# Patient Record
Sex: Female | Born: 1960 | Race: White | Hispanic: No | Marital: Single | Smoking: Never smoker
Health system: Southern US, Community
[De-identification: ages and names within clinical notes are randomized; demographics above are authoritative.]

## PROBLEM LIST (undated history)

## (undated) DIAGNOSIS — C73 Malignant neoplasm of thyroid gland: Secondary | ICD-10-CM

---

## 2015-07-10 ENCOUNTER — Emergency Department (HOSPITAL_COMMUNITY)
Admission: EM | Admit: 2015-07-10 | Discharge: 2015-07-10 | Disposition: A | Discharge status: Home / Self Care | Payer: PRIVATE HEALTH INSURANCE | Attending: Emergency Medicine | Admitting: Emergency Medicine

## 2015-07-10 ENCOUNTER — Emergency Department (HOSPITAL_COMMUNITY): Payer: PRIVATE HEALTH INSURANCE

## 2015-07-10 ENCOUNTER — Encounter (HOSPITAL_COMMUNITY): Payer: Self-pay

## 2015-07-10 DIAGNOSIS — R55 Syncope and collapse: Secondary | ICD-10-CM | POA: Diagnosis not present

## 2015-07-10 DIAGNOSIS — Z880 Allergy status to penicillin: Secondary | ICD-10-CM | POA: Diagnosis not present

## 2015-07-10 DIAGNOSIS — Z85850 Personal history of malignant neoplasm of thyroid: Secondary | ICD-10-CM | POA: Insufficient documentation

## 2015-07-10 DIAGNOSIS — R0602 Shortness of breath: Secondary | ICD-10-CM | POA: Diagnosis not present

## 2015-07-10 DIAGNOSIS — R079 Chest pain, unspecified: Secondary | ICD-10-CM | POA: Insufficient documentation

## 2015-07-10 DIAGNOSIS — E860 Dehydration: Secondary | ICD-10-CM | POA: Insufficient documentation

## 2015-07-10 HISTORY — DX: Malignant neoplasm of thyroid gland: C73

## 2015-07-10 LAB — CBC
HEMATOCRIT: 46.9 % — AB (ref 36.0–46.0)
Hemoglobin: 16.1 g/dL — ABNORMAL HIGH (ref 12.0–15.0)
MCH: 31.7 pg (ref 26.0–34.0)
MCHC: 34.3 g/dL (ref 30.0–36.0)
MCV: 92.3 fL (ref 78.0–100.0)
PLATELETS: 247 10*3/uL (ref 150–400)
RBC: 5.08 MIL/uL (ref 3.87–5.11)
RDW: 12.9 % (ref 11.5–15.5)
WBC: 5.7 10*3/uL (ref 4.0–10.5)

## 2015-07-10 LAB — I-STAT TROPONIN, ED
TROPONIN I, POC: 0.02 ng/mL (ref 0.00–0.08)
Troponin i, poc: 0 ng/mL (ref 0.00–0.08)

## 2015-07-10 LAB — CK: Total CK: 138 U/L (ref 38–234)

## 2015-07-10 LAB — BASIC METABOLIC PANEL
ANION GAP: 14 (ref 5–15)
BUN: 18 mg/dL (ref 6–20)
CALCIUM: 10.8 mg/dL — AB (ref 8.9–10.3)
CHLORIDE: 105 mmol/L (ref 101–111)
CO2: 23 mmol/L (ref 22–32)
Creatinine, Ser: 1.32 mg/dL — ABNORMAL HIGH (ref 0.44–1.00)
GFR calc Af Amer: 53 mL/min — ABNORMAL LOW (ref >60)
GFR calc non Af Amer: 46 mL/min — ABNORMAL LOW (ref >60)
GLUCOSE: 98 mg/dL (ref 65–99)
POTASSIUM: 4.3 mmol/L (ref 3.5–5.1)
Sodium: 142 mmol/L (ref 135–145)

## 2015-07-10 MED ORDER — SODIUM CHLORIDE 0.9 % IV BOLUS (SEPSIS)
1000.0000 mL | Freq: Once | INTRAVENOUS | Status: AC
  Administered 2015-07-10: 1000 mL via INTRAVENOUS

## 2015-07-10 NOTE — Discharge Instructions (Signed)
Syncope °Syncope is a medical term for fainting or passing out. This means you lose consciousness and drop to the ground. People are generally unconscious for less than 5 minutes. You may have some muscle twitches for up to 15 seconds before waking up and returning to normal. Syncope occurs more often in older adults, but it can happen to anyone. While most causes of syncope are not dangerous, syncope can be a sign of a serious medical problem. It is important to seek medical care.  °CAUSES  °Syncope is caused by a sudden drop in blood flow to the brain. The specific cause is often not determined. Factors that can bring on syncope include: °· Taking medicines that lower blood pressure. °· Sudden changes in posture, such as standing up quickly. °· Taking more medicine than prescribed. °· Standing in one place for too long. °· Seizure disorders. °· Dehydration and excessive exposure to heat. °· Low blood sugar (hypoglycemia). °· Straining to have a bowel movement. °· Heart disease, irregular heartbeat, or other circulatory problems. °· Fear, emotional distress, seeing blood, or severe pain. °SYMPTOMS  °Right before fainting, you may: °· Feel dizzy or light-headed. °· Feel nauseous. °· See all white or all black in your field of vision. °· Have cold, clammy skin. °DIAGNOSIS  °Your health care provider will ask about your symptoms, perform a physical exam, and perform an electrocardiogram (ECG) to record the electrical activity of your heart. Your health care provider may also perform other heart or blood tests to determine the cause of your syncope which may include: °· Transthoracic echocardiogram (TTE). During echocardiography, sound waves are used to evaluate how blood flows through your heart. °· Transesophageal echocardiogram (TEE). °· Cardiac monitoring. This allows your health care provider to monitor your heart rate and rhythm in real time. °· Holter monitor. This is a portable device that records your  heartbeat and can help diagnose heart arrhythmias. It allows your health care provider to track your heart activity for several days, if needed. °· Stress tests by exercise or by giving medicine that makes the heart beat faster. °TREATMENT  °In most cases, no treatment is needed. Depending on the cause of your syncope, your health care provider may recommend changing or stopping some of your medicines. °HOME CARE INSTRUCTIONS °· Have someone stay with you until you feel stable. °· Do not drive, use machinery, or play sports until your health care provider says it is okay. °· Keep all follow-up appointments as directed by your health care provider. °· Lie down right away if you start feeling like you might faint. Breathe deeply and steadily. Wait until all the symptoms have passed. °· Drink enough fluids to keep your urine clear or pale yellow. °· If you are taking blood pressure or heart medicine, get up slowly and take several minutes to sit and then stand. This can reduce dizziness. °SEEK IMMEDIATE MEDICAL CARE IF:  °· You have a severe headache. °· You have unusual pain in the chest, abdomen, or back. °· You are bleeding from your mouth or rectum, or you have black or tarry stool. °· You have an irregular or very fast heartbeat. °· You have pain with breathing. °· You have repeated fainting or seizure-like jerking during an episode. °· You faint when sitting or lying down. °· You have confusion. °· You have trouble walking. °· You have severe weakness. °· You have vision problems. °If you fainted, call your local emergency services (911 in [place] Do not drive   yourself to the hospital.  °MAKE SURE YOU: °· Understand these instructions. °· Will watch your condition. °· Will get help right away if you are not doing well or get worse. °Document Released: 11/26/2005 Document Revised: 12/01/2013 Document Reviewed: 01/25/2012 °[employer]® Patient Information ©2015 [employer]. This information is not intended to replace  advice given to you by your health care provider. Make sure you discuss any questions you have with your health care provider. ° °

## 2015-07-10 NOTE — ED Notes (Signed)
Dr. Walden at bedside 

## 2015-07-10 NOTE — ED Notes (Signed)
Pt returned to room from x-ray. Monitored by pulse ox, bp cuff, and 5-lead.
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
## 2016-01-29 IMAGING — DX DG CHEST 2V
2 series · 2 of 2 positions shown · non-contrast
Comparison: None.

CLINICAL DATA: Acute syncope today.

EXAM:
CHEST  2 VIEW

[chest pa]
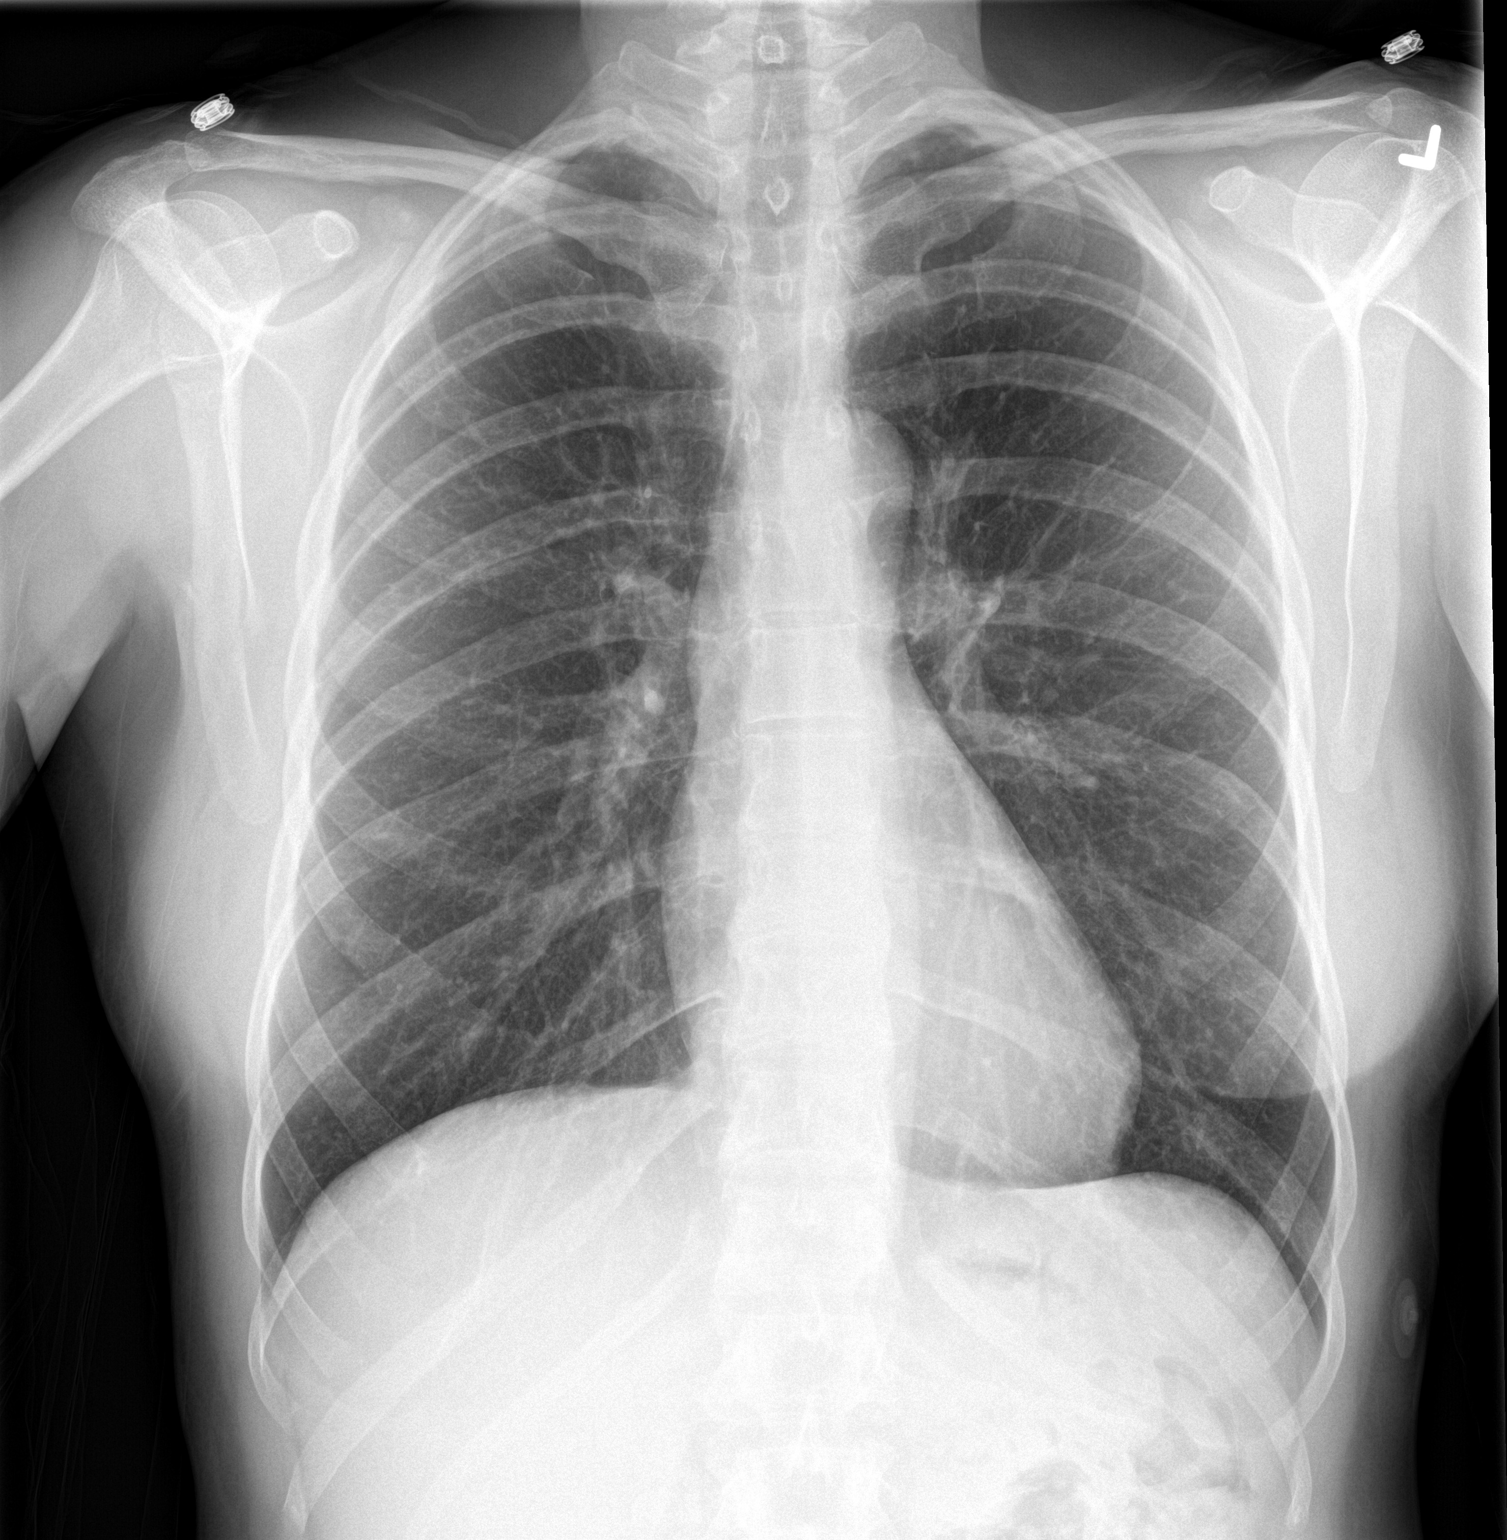

[chest lat]
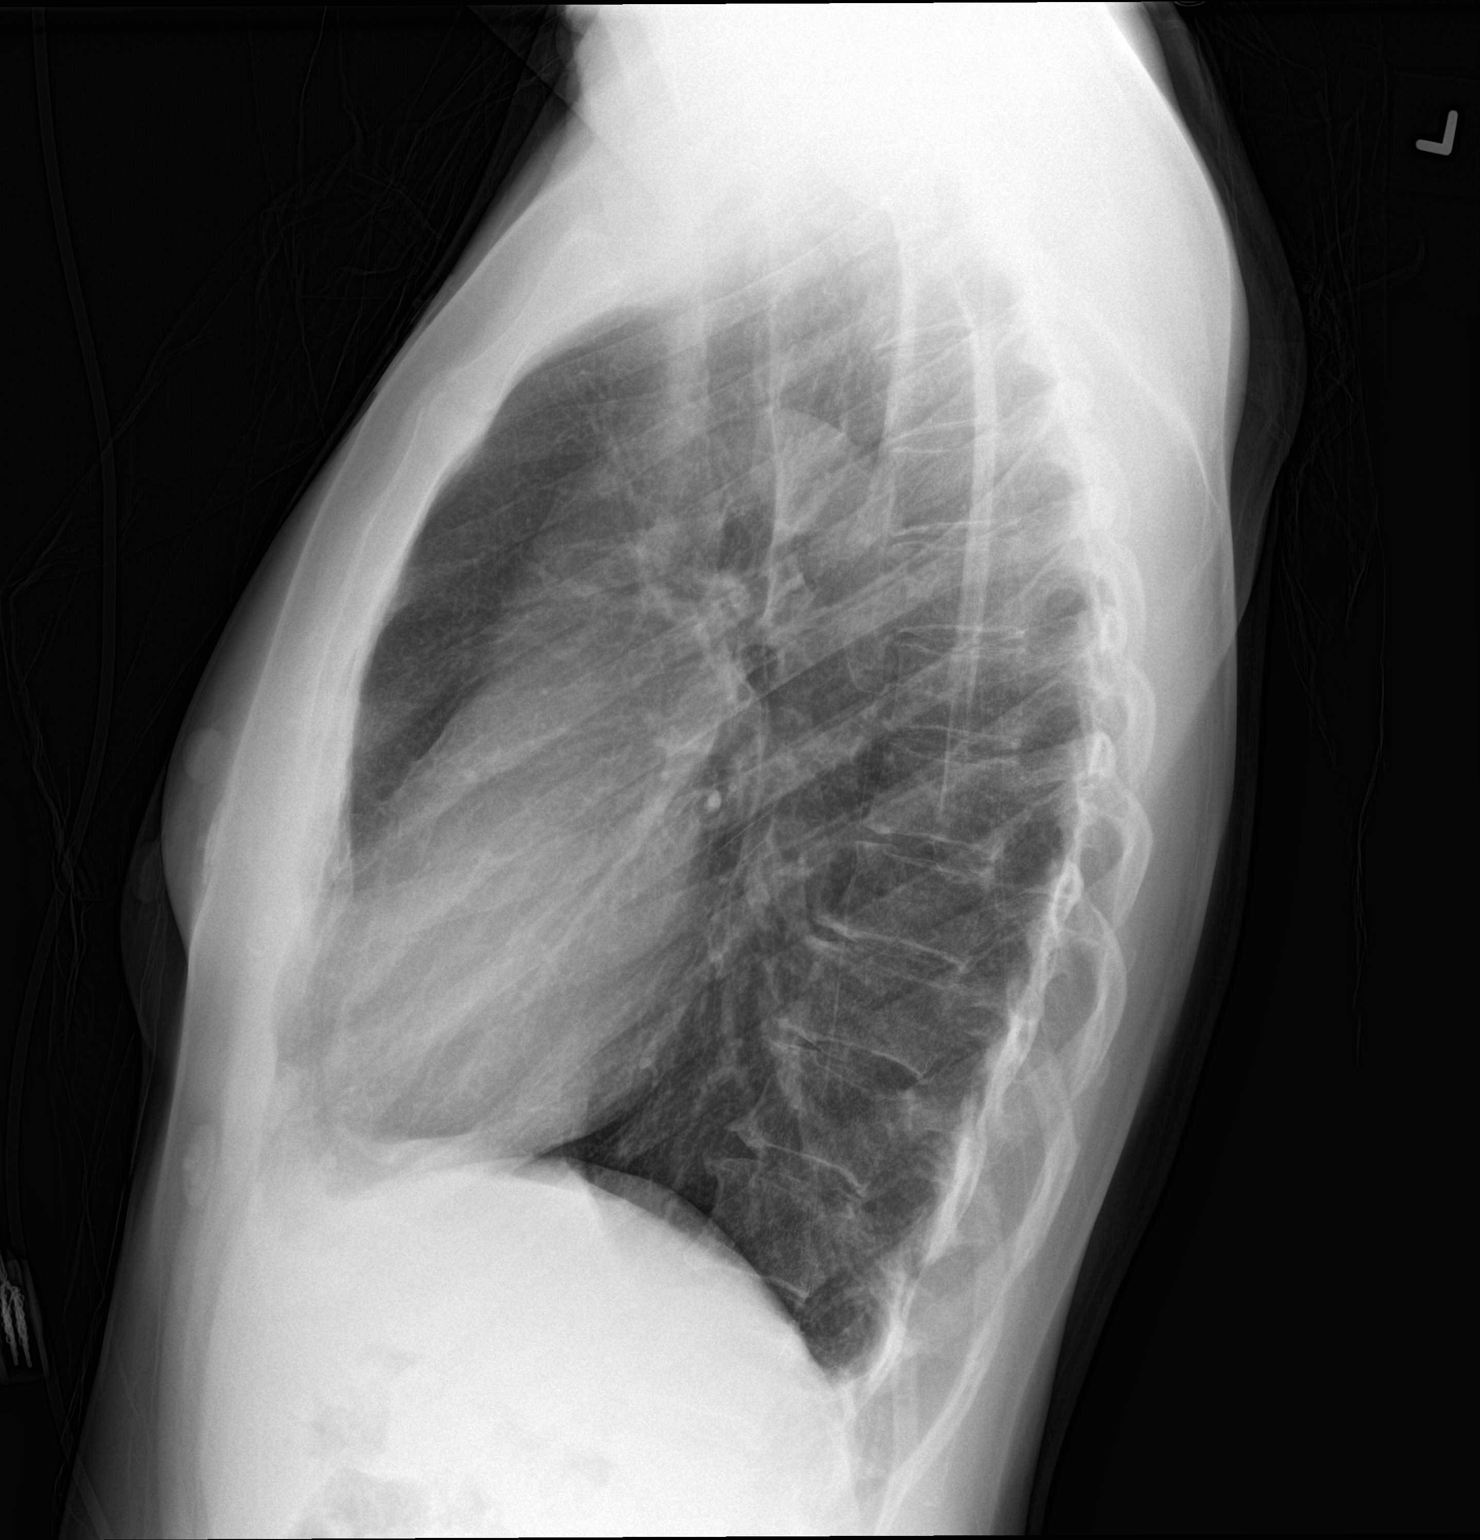

[2 of 2 positions shown; findings below may reference images not displayed]

FINDINGS: The cardiomediastinal silhouette is unremarkable.

There is no evidence of focal airspace disease, pulmonary edema,
suspicious pulmonary nodule/mass, pleural effusion, or pneumothorax.
No acute bony abnormalities are identified.
IMPRESSION: No active cardiopulmonary disease.
